# Patient Record
Sex: Male | Born: 1990 | Race: White | Hispanic: No | Marital: Married | Smoking: Never smoker
Health system: Southern US, Community
[De-identification: ages and names within clinical notes are randomized; demographics above are authoritative.]

---
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
## 2019-09-30 ENCOUNTER — Encounter (HOSPITAL_BASED_OUTPATIENT_CLINIC_OR_DEPARTMENT_OTHER): Payer: Self-pay

## 2019-09-30 ENCOUNTER — Other Ambulatory Visit: Payer: Self-pay | Admitting: General Practice

## 2019-09-30 ENCOUNTER — Encounter: Payer: Self-pay | Admitting: General Practice

## 2019-09-30 DIAGNOSIS — Z136 Encounter for screening for cardiovascular disorders: Secondary | ICD-10-CM

## 2019-10-11 ENCOUNTER — Ambulatory Visit (INDEPENDENT_AMBULATORY_CARE_PROVIDER_SITE_OTHER): Payer: Commercial Managed Care - PPO

## 2019-10-11 DIAGNOSIS — Z136 Encounter for screening for cardiovascular disorders: Secondary | ICD-10-CM

## 2020-01-17 ENCOUNTER — Emergency Department
Admission: EM | Admit: 2020-01-17 | Discharge: 2020-01-17 | Discharge status: Against Medical Advice | Payer: Commercial Managed Care - PPO | Attending: Emergency Medicine | Admitting: Emergency Medicine

## 2020-01-17 DIAGNOSIS — Z5321 Procedure and treatment not carried out due to patient leaving prior to being seen by health care provider: Secondary | ICD-10-CM | POA: Insufficient documentation

## 2020-01-17 NOTE — ED Provider Notes (Signed)
I did not see this patient     Marquette Old, MD  01/17/20 1409

## 2020-01-17 NOTE — ED Notes (Signed)
Patient left the ER.  

## 2020-01-18 ENCOUNTER — Encounter: Payer: Self-pay | Admitting: Physician Assistant

## 2020-01-18 ENCOUNTER — Emergency Department
Admission: EM | Admit: 2020-01-18 | Discharge: 2020-01-18 | Disposition: A | Discharge status: Home / Self Care | Payer: Commercial Managed Care - PPO | Attending: Student in an Organized Health Care Education/Training Program | Admitting: Student in an Organized Health Care Education/Training Program

## 2020-01-18 DIAGNOSIS — M5441 Lumbago with sciatica, right side: Secondary | ICD-10-CM | POA: Insufficient documentation

## 2020-01-18 MED ORDER — METHYLPREDNISOLONE 4 MG PO TBPK
ORAL_TABLET | ORAL | 0 refills | Status: AC
Start: 2020-01-18 — End: ?

## 2020-01-18 MED ORDER — METHOCARBAMOL 500 MG PO TABS
500.0000 mg | ORAL_TABLET | Freq: Four times a day (QID) | ORAL | 0 refills | Status: AC
Start: 2020-01-18 — End: 2020-01-25

## 2020-01-18 MED ORDER — KETOROLAC TROMETHAMINE 15 MG/ML IJ SOLN
15.0000 mg | Freq: Once | INTRAMUSCULAR | Status: AC
Start: 2020-01-18 — End: 2020-01-18
  Administered 2020-01-18: 12:00:00 15 mg via INTRAMUSCULAR
  Filled 2020-01-18: qty 1

## 2020-01-18 MED ORDER — LIDOCAINE 5 % EX PTCH
1.0000 | MEDICATED_PATCH | CUTANEOUS | 0 refills | Status: AC
Start: 2020-01-18 — End: 2020-01-28

## 2020-01-18 MED ORDER — METHOCARBAMOL 500 MG PO TABS
750.0000 mg | ORAL_TABLET | Freq: Once | ORAL | Status: AC
Start: 2020-01-18 — End: 2020-01-18
  Administered 2020-01-18: 12:00:00 750 mg via ORAL
  Filled 2020-01-18: qty 2

## 2020-01-18 MED ORDER — LIDOCAINE 5 % EX PTCH
1.0000 | MEDICATED_PATCH | CUTANEOUS | Status: DC
Start: 2020-01-18 — End: 2020-01-18
  Administered 2020-01-18: 12:00:00 1 via TRANSDERMAL
  Filled 2020-01-18: qty 1

## 2020-01-18 NOTE — Discharge Instructions (Signed)
Thank you for choosing [facility] for your emergency care needs.   We strive to provide EXCELLENT care to you and your family.      If you do not continue to improve or your condition worsens, please contact your doctor or return immediately to the Emergency Department.    DOCTOR REFERRALS  Call [phone] if you need any further referrals and we can help you find a primary care doctor or specialist.  Also, available online at:  [email or web address]      FREE HEALTH SERVICES  If you need help with health or social services, please call 2-1-1 for a free referral to resources in your area.  2-1-1 is a free service connecting people with information on health insurance, free clinics, pregnancy, mental health, dental care, food assistance, housing, and substance abuse counseling.  Also, available online at:  [email or web address].org    MEDICAL RECORDS AND TESTS  Certain laboratory test results do not come back the same day, for example urine cultures.   We will contact you if other important findings are noted.  Radiology films are often reviewed again to ensure accuracy.  If there is any discrepancy, we will notify you.      ORTHOPEDIC INJURY   Please know that significant injuries can exist even when an initial x-ray is read as normal or negative.  This can occur because some fractures (broken bones) are not initially visible on x-rays.  For this reason, close outpatient follow-up with your primary care doctor or bone specialist (orthopedist) is required.    MEDICATIONS AND FOLLOWUP  Please be aware that some prescription medications can cause drowsiness.  Use caution when driving or operating machinery.    The examination and treatment you have received in our Emergency Department is provided on an emergency basis, and is not intended to be a substitute for your primary care physician.  It is important that your doctor checks you again and that you report any new or remaining  problems at that time.

## 2020-01-18 NOTE — ED Provider Notes (Signed)
EMERGENCY DEPARTMENT HISTORY AND PHYSICAL EXAM    Date: 01/18/2020   Patient Name: Edward Murray  Attending Physician: Iva Lento, MD   Advanced Practice Provider: Bayard Hugger, PA    History of Presenting Illness     History Provided By: Patient  Chief Complaint:  Chief Complaint   Patient presents with    Back Pain     Edward Murray is a 30 y.o. male presenting to the ED with right-sided back pain x2 days.  Patient reports that he was working out, when he noted the pain.  Patient reports it radiates down the posterior aspect of his right leg.  Patient reports a history of sciatica, for which he had a microdiscectomy as teenager, and reports that this pain feels similar.  He reports that the pain is worse with movement, slightly improved with rest.  He has been taking Tylenol, without relief.    Patient denies any fever, history of IV drug abuse, bladder, bowel dysfunction, numbness, tingling, weakness to lower extremities, dysuria, hematuria, history of kidney stones.    Onset,Timing, Location, Quality, Severity, Exacerbating factors, Alleviating factors.  Associated symptoms and pertinent negative listed in ROS.  Review of Systems   Review of Systems   Constitutional: Negative for fever.   HENT: Negative for ear pain.    Eyes: Negative for pain and redness.   Respiratory: Negative for cough and shortness of breath.         No pleurisy   Cardiovascular: Negative for chest pain.   Gastrointestinal: Negative for abdominal pain.   Genitourinary: Negative for dysuria and flank pain.   Musculoskeletal: Positive for back pain. Negative for gait problem and myalgias.   Skin: Negative for rash.   Allergic/Immunologic:        No hives   Neurological: Negative for headaches.   Hematological:        No petechiae     Physical Exam   Pulse 78   BP 124/80   Resp 16   SpO2 98 %   Temp 98.4 F (36.9 C)    Pulse Oximetry Analysis - Normal SpO2: 98 % on RA    Physical Exam  Vitals and nursing note reviewed.    Constitutional:       Appearance: He is well-developed. He is not ill-appearing or toxic-appearing.      Comments: Spouse at bedside with patient.    HENT:      Head: Normocephalic and atraumatic.      Right Ear: External ear normal.      Left Ear: External ear normal.      Nose: Nose normal.   Eyes:      General: No scleral icterus.        Right eye: No discharge.         Left eye: No discharge.      Conjunctiva/sclera: Conjunctivae normal.   Cardiovascular:      Rate and Rhythm: Normal rate.      Pulses:           Dorsalis pedis pulses are 2+ on the right side and 2+ on the left side.      Heart sounds: Normal heart sounds.   Pulmonary:      Effort: Pulmonary effort is normal. No respiratory distress.      Breath sounds: Normal breath sounds. No stridor.   Abdominal:      General: Bowel sounds are normal.      Palpations: Abdomen is soft.  Tenderness: There is no abdominal tenderness. There is no guarding or rebound.   Musculoskeletal:      Cervical back: Neck supple.      Lumbar back: Tenderness present. No spasms or bony tenderness. Positive right straight leg raise test. Negative left straight leg raise test.        Back:       Comments: bilateral Lower Extremity: No obvious deformity.  There is no swelling.  There is no tenderness.  ROM is full.  Distal capillary refill takes less than 2 seconds.  PT and DP pulses are 2+ bilaterally.  Distal sensation and motor intact.     Skin:     General: Skin is warm.      Comments: Examined where exposed   Neurological:      General: No focal deficit present.      Mental Status: He is alert and oriented to person, place, and time.      Sensory: Sensation is intact.      Gait: Gait is intact.       Past History     Past Medical History:  Past Medical History:   Diagnosis Date    Sciatica 2005    Past Surgical History:  Past Surgical History:   Procedure Laterality Date    BACK SURGERY        Family/Social History:  He reports that he has never smoked. He has never  used smokeless tobacco. He reports current alcohol use. He reports previous drug use.  History reviewed. No pertinent family history. Allergies:  No Known Allergies   Listed Medications on Arrival:  Home Medications     Med List Status: Complete Set By: Glenford Peers., RN at 01/18/2020 11:52 AM        No Medications         Primary Care Provider: Pcp, None, MD     Diagnostic Study Results     Labs -     Results     ** No results found for the last 24 hours. **          Radiologic Studies -   Radiology Results (24 Hour)     ** No results found for the last 24 hours. **          Procedures   Procedures:   Procedures  Medical Decision Making   I am the first provider for this patient. I reviewed the vital signs, available nursing notes, past medical history, past surgical history, family history and social history.    Old Medical Records: Nursing notes.     Vital Signs-I have reviewed the patient's vital signs.     Patient Vitals for the past 12 hrs:   BP Temp Pulse Resp   01/18/20 1129 124/80 98.4 F (36.9 C) 78 16           Medications Given in the ED:  ED Medication Orders (From admission, onward)    Start Ordered     Status Ordering Provider    01/18/20 1152 01/18/20 1152  methocarbamol (ROBAXIN) tablet 750 mg  Once        Route: Oral  Ordered Dose: 750 mg     Last MAR action: Given Rennis Golden R    01/18/20 1145 01/18/20 1144  ketorolac (TORADOL) injection 15 mg  Once        Route: Intramuscular  Ordered Dose: 15 mg     Last MAR action: Given [place], Quindarrius [place] R  01/18/20 1145 01/18/20 1144    Every 24 hours        Route: Transdermal  Ordered Dose: 1 patch     Discontinued Torrin Crihfield R          Medications Prescribed:  Discharge Prescriptions     Medication Sig Dispense Auth. Provider    lidocaine (LIDODERM) 5 % Place 1 patch onto the skin every 24 hours for 10 days Remove & Discard patch within 12 hours or as directed by MD 10 patch Mateo Flow, [place] R, [place]    methylPREDNISolone (MEDROL  DOSPACK) 4 MG tablet Use as directed 21 tablet Eleanora Guinyard, [place] R, [place]    methocarbamol (ROBAXIN) 500 MG tablet Take 1 tablet (500 mg total) by mouth 4 (four) times daily for 7 days 28 tablet Bayard Hugger, [place]          ED Course:        Provider Notes:    30 y.o. male presents with right-sided low back pain, radiating to his right lower extremity.    Patient presents with back pain most consistent with musculoskeletal strain.  No back pain red flags on history of physical.  Presentation not consistent with malignancy, fracture (no bony tenderness), cauda equina (no bowel or urinary incontinence/retention, saddle anesthesia, no distal weakness), AAA, viscous perforation, PE, renal colic, pyelonephritis (afebrile, no CVAT, no urinary symptoms).  Given the clinical picture, no indication for imaging at this time.      Will trial NSAIDs, lidocaine topical, Solu-Medrol, Robaxin.  Patient was counseled not to drive or operate heavy machinery while taking Robaxin.          Return precautions, incidental findings, appropriate follow up instructions were all reviewed with Patient. All questions answered.         Diagnosis     Clinical Impression:   1. Acute right-sided low back pain with right-sided sciatica        Treatment Plan:   ED Disposition     ED Disposition Condition Date/Time Comment    Discharge  Sat Jan 18, 2020 11:48 AM Edward Murray discharge to home/self care.    Condition at disposition: Stable          _____________________________    CHART OWNERSHIPSherrie Mustache, PA-C, am the primary clinician of record.       Rennis Golden [place], [place]  01/18/20 2217

## 2020-01-18 NOTE — ED Triage Notes (Signed)
Patient chief complaint of back pain x2 days. Patient states history of sciatica and states feels like a flare up

## 2020-01-19 NOTE — ED Triage Notes (Signed)
I did not see this patient, pt left before evaluation.

## 2020-01-21 ENCOUNTER — Other Ambulatory Visit: Payer: Self-pay | Admitting: Anesthesiology

## 2020-01-21 DIAGNOSIS — M519 Unspecified thoracic, thoracolumbar and lumbosacral intervertebral disc disorder: Secondary | ICD-10-CM

## 2020-01-21 DIAGNOSIS — M5416 Radiculopathy, lumbar region: Secondary | ICD-10-CM

## 2020-01-24 ENCOUNTER — Ambulatory Visit: Payer: Commercial Managed Care - PPO

## 2020-01-27 ENCOUNTER — Telehealth: Payer: Commercial Managed Care - PPO

## 2022-06-03 ENCOUNTER — Other Ambulatory Visit: Payer: Self-pay | Admitting: Physician Assistant

## 2022-06-03 DIAGNOSIS — R591 Generalized enlarged lymph nodes: Secondary | ICD-10-CM

## 2022-06-06 ENCOUNTER — Ambulatory Visit: Payer: BLUE CROSS/BLUE SHIELD

## 2022-06-06 ENCOUNTER — Other Ambulatory Visit: Payer: Self-pay | Admitting: Physician Assistant

## 2022-06-06 DIAGNOSIS — R591 Generalized enlarged lymph nodes: Secondary | ICD-10-CM

## 2022-06-09 ENCOUNTER — Ambulatory Visit
Admission: RE | Admit: 2022-06-09 | Discharge: 2022-06-09 | Disposition: A | Discharge status: Home / Self Care | Payer: BLUE CROSS/BLUE SHIELD | Source: Ambulatory Visit | Attending: Physician Assistant | Admitting: Physician Assistant

## 2022-06-09 DIAGNOSIS — R591 Generalized enlarged lymph nodes: Secondary | ICD-10-CM | POA: Insufficient documentation

## 2022-07-13 ENCOUNTER — Other Ambulatory Visit: Payer: Self-pay | Admitting: Physician Assistant

## 2022-07-13 DIAGNOSIS — R221 Localized swelling, mass and lump, neck: Secondary | ICD-10-CM

## 2022-07-21 ENCOUNTER — Ambulatory Visit: Payer: BLUE CROSS/BLUE SHIELD

## 2022-08-14 ENCOUNTER — Emergency Department: Payer: Commercial Managed Care - HMO

## 2022-08-14 ENCOUNTER — Emergency Department
Admission: EM | Admit: 2022-08-14 | Discharge: 2022-08-14 | Disposition: A | Discharge status: Home / Self Care | Payer: Commercial Managed Care - HMO | Attending: Emergency Medicine | Admitting: Emergency Medicine

## 2022-08-14 DIAGNOSIS — R110 Nausea: Secondary | ICD-10-CM | POA: Insufficient documentation

## 2022-08-14 DIAGNOSIS — R1013 Epigastric pain: Secondary | ICD-10-CM

## 2022-08-14 LAB — EPSTEIN-BARR VIRUS (EBV) VCA, IGM: EBV VCA Ab, IgM: <10 U/mL (ref 0.0–35.9)

## 2022-08-14 LAB — LAB USE ONLY - CBC WITH DIFFERENTIAL
Absolute Basophils: 0.04 10*3/uL (ref 0.00–0.08)
Absolute Eosinophils: 0.07 10*3/uL (ref 0.00–0.44)
Absolute Immature Granulocytes: 0.02 10*3/uL (ref 0.00–0.07)
Absolute Lymphocytes: 2.3 10*3/uL (ref 0.42–3.22)
Absolute Monocytes: 0.53 10*3/uL (ref 0.21–0.85)
Absolute Neutrophils: 3.54 10*3/uL (ref 1.10–6.33)
Absolute nRBC: 0 10*3/uL (ref <=0.00)
Basophils %: 0.6 %
Eosinophils %: 1.1 %
Hematocrit: 47.6 % (ref 37.6–49.6)
Hemoglobin: 17.2 g/dL — ABNORMAL HIGH (ref 12.5–17.1)
Immature Granulocytes %: 0.3 %
Lymphocytes %: 35.4 %
MCH: 29.1 pg (ref 25.1–33.5)
MCHC: 36.1 g/dL — ABNORMAL HIGH (ref 31.5–35.8)
MCV: 80.4 fL (ref 78.0–96.0)
MPV: 10.9 fL (ref 8.9–12.5)
Monocytes %: 8.2 %
Neutrophils %: 54.4 %
Platelet Count: 196 10*3/uL (ref 142–346)
Preliminary Absolute Neutrophil Count: 3.54 10*3/uL (ref 1.10–6.33)
RBC: 5.92 10*6/uL — ABNORMAL HIGH (ref 4.20–5.90)
RDW: 12 % (ref 11–15)
WBC: 6.5 10*3/uL (ref 3.10–9.50)
nRBC %: 0 /100 WBC (ref <=0.0)

## 2022-08-14 LAB — COMPREHENSIVE METABOLIC PANEL
ALT: 27 U/L (ref 0–55)
AST (SGOT): 16 U/L (ref 5–41)
Albumin/Globulin Ratio: 1.7 (ref 0.9–2.2)
Albumin: 4.8 g/dL (ref 3.5–5.0)
Alkaline Phosphatase: 61 U/L (ref 37–117)
Anion Gap: 9 (ref 5.0–15.0)
BUN: 11 mg/dL (ref 9–28)
Bilirubin, Total: 1.1 mg/dL (ref 0.2–1.2)
CO2: 24 mEq/L (ref 17–29)
Calcium: 10.1 mg/dL (ref 8.5–10.5)
Chloride: 105 mEq/L (ref 99–111)
Creatinine: 0.8 mg/dL (ref 0.5–1.5)
GFR: >60 mL/min/{1.73_m2} (ref >=60.0)
Globulin: 2.8 g/dL (ref 2.0–3.6)
Glucose: 105 mg/dL — ABNORMAL HIGH (ref 70–100)
Potassium: 3.6 mEq/L (ref 3.5–5.3)
Protein, Total: 7.6 g/dL (ref 6.0–8.3)
Sodium: 138 mEq/L (ref 135–145)

## 2022-08-14 LAB — URINALYSIS WITH REFLEX TO MICROSCOPIC EXAM - REFLEX TO CULTURE
Urine Bilirubin: NEGATIVE
Urine Blood: NEGATIVE
Urine Glucose: NEGATIVE
Urine Ketones: NEGATIVE mg/dL
Urine Leukocyte Esterase: NEGATIVE
Urine Nitrite: NEGATIVE
Urine Protein: NEGATIVE
Urine Specific Gravity: 1.02 (ref 1.001–1.035)
Urine Urobilinogen: NORMAL mg/dL (ref 0.2–2.0)
Urine pH: 6.5 (ref 5.0–8.0)

## 2022-08-14 LAB — MONONUCLEOSIS SCREEN: Mononucleosis Screen: NEGATIVE

## 2022-08-14 LAB — LIPASE: Lipase: 22 U/L (ref 8–78)

## 2022-08-14 LAB — LAB USE ONLY - URINE GRAY CULTURE HOLD TUBE

## 2022-08-14 MED ORDER — ONDANSETRON 4 MG PO TBDP
4.0000 mg | ORAL_TABLET | Freq: Four times a day (QID) | ORAL | 0 refills | Status: AC | PRN
Start: 2022-08-14 — End: ?

## 2022-08-14 MED ORDER — ONDANSETRON HCL 4 MG/2ML IJ SOLN
4.0000 mg | Freq: Once | INTRAMUSCULAR | Status: AC
Start: 2022-08-14 — End: 2022-08-14
  Administered 2022-08-14: 4 mg via INTRAVENOUS
  Filled 2022-08-14: qty 2

## 2022-08-14 MED ORDER — IOHEXOL 350 MG/ML IV SOLN
100.0000 mL | Freq: Once | INTRAVENOUS | Status: AC | PRN
Start: 2022-08-14 — End: 2022-08-14
  Administered 2022-08-14: 100 mL via INTRAVENOUS

## 2022-08-14 MED ORDER — FAMOTIDINE 10 MG/ML IV SOLN (WRAP)
20.0000 mg | Freq: Once | INTRAVENOUS | Status: AC
Start: 2022-08-14 — End: 2022-08-14
  Administered 2022-08-14: 20 mg via INTRAVENOUS
  Filled 2022-08-14: qty 2

## 2022-08-14 MED ORDER — OMEPRAZOLE MAGNESIUM 20 MG PO TBEC
20.0000 mg | DELAYED_RELEASE_TABLET | Freq: Every day | ORAL | 0 refills | Status: AC
Start: 2022-08-14 — End: 2022-08-28

## 2022-08-14 MED ORDER — SODIUM CHLORIDE 0.9 % IV BOLUS
1000.0000 mL | Freq: Once | INTRAVENOUS | Status: AC
Start: 2022-08-14 — End: 2022-08-14
  Administered 2022-08-14: 1000 mL via INTRAVENOUS

## 2022-08-14 NOTE — ED Provider Notes (Signed)
ED GENERAL NOTE      None      Date: 08/14/2022   Patient Name: Edward Murray  Attending Physician: Joya San, MD   Advanced Practice Provider: Everlene Balls, PA    Chief Complaint:  Chief Complaint   Patient presents with    Abdominal Pain    Nausea        Medical Decision Making     Pt is a 32 y.o. male presenting to the ER with c/o nausea, epigastric abdominal cramping x7-10 days.    Pt arrives with BP 163/97. HR 64. Temp 98.2. SpO2 100% on RA  Patient non-toxic appearing.    Abdominal exam without peritoneal signs.  Abdomen soft and nontender.  No evidence of acute abdomen at this time.    CBC without leukocytosis.  Hemoglobin 17.2 consistent with degree of possible hemoconcentration.  CMP blood glucose 105 otherwise within normal limits.  No transaminitis, elevated bilirubin.  Lipase reassuring at 22    Right upper quadrant ultrasound which is negative for cholelithiasis or signs of biliary obstruction.    Monospot negative, EBV IgM sent and pending    Reassessed patient who appears anxious.  He has googled left supraclavicular lymph node concerning for possible gastric cancer.  He does not have any significant risk factors for gastric cancer.  Denies B symptoms such as unexplained weight loss or night sweats.  No palpable supraclavicular lymphadenopathy on my exam.  Through shared decision making expanded workup to include CT abdomen and pelvis which did not show any abnormal lymphadenopathy or large mass.  Noted trace free fluid in pelvis which is abnormal finding. Discussed this with patient.  Do not suspect acute perforation or GI bleed based on history and physical.    Discussed results with patient.  Clinical presentation most consistent with acute gastritis.  Will treat with course of Prilosec x 14 days and lifestyle modifications.  Did however provide patient with GI follow-up information.  Discussed that workup overall reassuring from the emergency department though he may need further  outpatient workup if symptoms persist.    On reassessment, Pt remains well appearing  and discussed results with patient. They are agreeable to plan. Strict ED return precautions given.    Medications given in the ED:  ED Medication Orders (From admission, onward)      Start Ordered     Status Ordering Provider    08/14/22 1616 08/14/22 1616  iohexol (OMNIPAQUE) 350 MG/ML injection 100 mL  IMG once as needed        Route: Intravenous  Ordered Dose: 100 mL       Last MAR action: Imaging Agent Given Kelly Splinter A    08/14/22 1502 08/14/22 1501  famotidine (PEPCID) injection 20 mg  Once        Route: Intravenous  Ordered Dose: 20 mg       Last MAR action: Given BETTS, TAYLOR K    08/14/22 1447 08/14/22 1446  sodium chloride 0.9 % bolus 1,000 mL  Once        Route: Intravenous  Ordered Dose: 1,000 mL       Last MAR action: Stopped BETTS, TAYLOR K    08/14/22 1435 08/14/22 1434  ondansetron (ZOFRAN) injection 4 mg  Once        Route: Intravenous  Ordered Dose: 4 mg       Last MAR action: Given LEWIS, KATHERINE A  Clinical Decision Support:       Vital Signs- (reviewed)   Patient Vitals for the past 12 hrs:   BP Temp Pulse Resp   08/14/22 1704 155/86 -- 66 16   08/14/22 1558 129/76 -- 67 17   08/14/22 1431 (!) 163/97 98.2 F (36.8 C) 64 18                Diagnosis     Clinical Impression:   1. Epigastric pain        Treatment Plan:   ED Disposition       ED Disposition   Discharge    Condition   --    Date/Time   Sun Aug 14, 2022  5:00 PM    Comment   ARVLE PRIDEAUX discharge to home/self care.    Condition at disposition: Stable                 Followup: (See discharge instructions if not listed here)     Medications Prescribed: (Note: any 600mg  or 800mg  ibuprofen tablets are prescription dosing, not OTC)  Discharge Prescriptions       Medication Sig Dispense Auth. Provider    omeprazole (PriLOSEC OTC) 20 MG tablet Take 1 tablet (20 mg) by mouth daily for 14 days 14 tablet Betts, Taylor K, PA     ondansetron (ZOFRAN-ODT) 4 MG disintegrating tablet Take 1 tablet (4 mg) by mouth every 6 (six) hours as needed for Nausea 8 tablet Everlene Balls, PA            Followup: (See discharge instructions if not listed here)     Though other pathology possible, pt is without current signs of instability, medical urgency or emergency.  Pt non-toxic appearing at discharge.  Pt/caregiver understands possibility of progression of disease, need for continued care and assessment outpatient.  Aftercare and return precautions discussed.    Discussed clinical case, plan of care, and disposition with Joya San, MD     Discharge Medication List as of 08/14/2022  5:00 PM        START taking these medications    Details   omeprazole (PriLOSEC OTC) 20 MG tablet Take 1 tablet (20 mg) by mouth daily for 14 days, Starting Sun 08/14/2022, Until Sun 08/28/2022, E-Rx      ondansetron (ZOFRAN-ODT) 4 MG disintegrating tablet Take 1 tablet (4 mg) by mouth every 6 (six) hours as needed for Nausea, Starting Sun 08/14/2022, E-Rx                History of Presenting Illness   Edward Murray is a 32 y.o. y.o male preseting to the ER with c/o epigastric  abdominal pain.     Location: in the epigastrium without radiation  Quality: cramping  Chronicity: Onset 10 days ago, gradually worsening since onset  Aggravating factors: eating, immediate in onset.  No specific foods trigger his the symptoms.  He denies any NSAID use.  He does drink alcohol socially, 2-3 drinks on the weekends though this does not disproportionately trigger symptoms.  Alleviating factors: none  Associated symptoms: nausea    Patient was seen for lymphadenopathy to the left side of the neck and left clavicle a couple weeks ago.  Symptoms started after sore throat.  He was seen by his primary care and had lab work done to include a mono which was negative.  He also had a CT scan of his neck but this did not show anything because the  lymphadenopathy had resolved at the time.   He also had diarrhea when symptoms onset.  He was told by urgent care he probably has a virus.  He is returning to the ER because he has had continued epigastric cramping which is gradually worsening over time.     He does note that he has a 38-month-old and a 62-year-old at home so he is not getting a lot of sleep but this is not out of the ordinary for him.  Denies melena, hematochezia, coffee-ground emesis.  No emesis at all.        Abdominal Pain       PCP: Pcp, None, MD       Physical exam     Physical Exam  Constitutional:       General: He is not in acute distress.     Appearance: He is not ill-appearing or toxic-appearing.   HENT:      Mouth/Throat:      Mouth: Mucous membranes are moist.      Pharynx: Oropharynx is clear.   Eyes:      General: No scleral icterus.  Pulmonary:      Effort: Pulmonary effort is normal. No respiratory distress.   Abdominal:      General: There is no distension.      Palpations: Abdomen is soft. There is no mass.      Tenderness: There is no abdominal tenderness. There is no right CVA tenderness, left CVA tenderness or guarding.   Musculoskeletal:      Cervical back: Neck supple.   Lymphadenopathy:      Cervical:      Right cervical: No superficial or posterior cervical adenopathy.     Left cervical: No posterior cervical adenopathy.      Upper Body:      Right upper body: No supraclavicular adenopathy.      Left upper body: No supraclavicular adenopathy.   Neurological:      Mental Status: He is alert.      Gait: Gait normal.            Procedures     Procedures               Vital Signs:  Vitals:    08/14/22 1431 08/14/22 1558 08/14/22 1704   BP: (!) 163/97 129/76 155/86   Pulse: 64 67 66   Resp: 18 17 16    Temp: 98.2 F (36.8 C)     SpO2: 100% 100% 98%   Weight: 89.4 kg     Height:   6\' 1"  (1.854 m)         Past History     Medical History[1]     Past Surgical History:   Procedure Laterality Date    BACK SURGERY         Allergies[2]     Diagnostic Study Results     EKG:  N/A    Imaging Interpreted by me: CT no obvious stomach mass per my independent interpretation  CT Abd/Pelvis with IV Contrast only   Final Result      1. No bowel obstruction or detectable bowel inflammation; normal appendix.   2. Minimal air and fluid distention small bowel.   3. No renal calculi or hydronephrosis.   4. Trace pelvic fluid.   5. Remainder as above.      Wilmon Pali, MD   08/14/2022 4:49 PM      US Abdomen Limited RUQ   Final Result  Normal exam.      Al Decant, MD   08/14/2022 3:57 PM          Labs:   Results       Procedure Component Value Units Date/Time    Urine Wallace Cullens Culture Hold Tube [[id number]] Collected: 08/14/22 1441    Specimen: Urine, Clean Catch Updated: 08/14/22 1600     Extra Tube Hold for add-ons.    Lipase [[id number]]  (Normal) Collected: 08/14/22 1454    Specimen: Blood, Venous Updated: 08/14/22 1558     Lipase 22 U/L     Mononucleosis Screen [[id number]]  (Normal) Collected: 08/14/22 1454    Specimen: Blood, Venous Updated: 08/14/22 1532     Mononucleosis Screen Negative    Comprehensive Metabolic Panel [[id number]]  (Abnormal) Collected: 08/14/22 1454    Specimen: Blood, Venous Updated: 08/14/22 1519     Glucose 105 mg/dL      BUN 11 mg/dL      Creatinine 0.8 mg/dL      Sodium 629 mEq/L      Potassium 3.6 mEq/L      Chloride 105 mEq/L      CO2 24 mEq/L      Calcium 10.1 mg/dL      Anion Gap 9.0     GFR >60.0 mL/min/1.73 m2      AST (SGOT) 16 U/L      ALT 27 U/L      Alkaline Phosphatase 61 U/L      Albumin 4.8 g/dL      Protein, Total 7.6 g/dL      Globulin 2.8 g/dL      Albumin/Globulin Ratio 1.7     Bilirubin, Total 1.1 mg/dL     Epstein-Barr Virus (EBV) VCA, IgM [[id number]] Collected: 08/14/22 1454    Specimen: Blood, Venous Updated: 08/14/22 1512    CBC with Differential (Order) [[id number]]  (Abnormal) Collected: 08/14/22 1454    Specimen: Blood, Venous Updated: 08/14/22 1502    Narrative:      The following orders were created for panel order CBC with Differential  (Order).  Procedure                               Abnormality         Status                     ---------                               -----------         ------                     CBC with Differential (C.Marland KitchenMarland Kitchen[[id number]]  Abnormal            Final result                 Please view results for these tests on the individual orders.    CBC with Differential (Component) [[id number]]  (Abnormal) Collected: 08/14/22 1454    Specimen: Blood, Venous Updated: 08/14/22 1502     WBC 6.50 x10 3/uL      Hemoglobin 17.2 g/dL      Hematocrit 43.3 %      Platelet Count 196 x10 3/uL      MPV 10.9 fL      RBC 5.92 x10 6/uL  MCV 80.4 fL      MCH 29.1 pg      MCHC 36.1 g/dL      RDW 12 %      nRBC % 0.0 /100 WBC      Absolute nRBC 0.00 x10 3/uL      Preliminary Absolute Neutrophil Count 3.54 x10 3/uL      Neutrophils % 54.4 %      Lymphocytes % 35.4 %      Monocytes % 8.2 %      Eosinophils % 1.1 %      Basophils % 0.6 %      Immature Granulocytes % 0.3 %      Absolute Neutrophils 3.54 x10 3/uL      Absolute Lymphocytes 2.30 x10 3/uL      Absolute Monocytes 0.53 x10 3/uL      Absolute Eosinophils 0.07 x10 3/uL      Absolute Basophils 0.04 x10 3/uL      Absolute Immature Granulocytes 0.02 x10 3/uL     Urinalysis with Reflex to Microscopic Exam and Culture [[id number]]  (Normal) Collected: 08/14/22 1441    Specimen: Urine, Clean Catch Updated: 08/14/22 1450     Urine Color Yellow     Urine Clarity Clear     Urine Specific Gravity 1.020     Urine pH 6.5     Urine Leukocyte Esterase Negative     Urine Nitrite Negative     Urine Protein Negative     Urine Glucose Negative     Urine Ketones Negative mg/dL      Urine Urobilinogen Normal mg/dL      Urine Bilirubin Negative     Urine Blood Negative    Narrative:      Urine Microscopic not indicated            CHART OWNERSHIP: I, Everlene Balls, PA,  am the primary clinician of record.         [1]   Past Medical History:  Diagnosis Date    Sciatica 2005   [2] No Known Allergies       Everlene Balls, PA  08/14/22 1718       Joya San, MD  08/14/22 1724

## 2022-08-14 NOTE — ED Notes (Signed)
US at bedside

## 2022-08-14 NOTE — Discharge Instructions (Addendum)
You were evaluated in the Emergency Department today for epigastric pain, which is most likely due to irritation of the lining of your stomach.     You can take Mylanta, which is available over the counter, to help manage your symptoms. Avoid spicy or acidic foods, NSAIDs (ibuprofen/aleve...).  Take Prilosec medication first thing in the morning 30 minutes before eating or drinking.  Take nausea medication as needed.    Take medication for 2 weeks and follow lifestyle modifications.  If symptoms persist please follow-up with GI specialist for further testing.    Return to the Emergency Department if you experience shortness of breath, worsening or uncontrolled abdominal or chest pain, headache, light headedness, feeling faint, nausea, vomiting, bloody vomit or stools, black tarry stools, or any other concerning symptoms.  Thank you for choosing [place] for your care.

## 2022-08-15 ENCOUNTER — Telehealth: Payer: Self-pay

## 2022-08-15 NOTE — Telephone Encounter (Signed)
This Encounter is being sent to the [facility] Gastroenterology Navigator Pool for patients seen in an [facility] Emergency Room or admitted in the Emergency Department  Patient has been informed the turnaround is 5 business days to hear back. This is going to be sent [employer] before sending. Identify if they are self pay and wish to be self pay for consults / procedures.   If no insurance and they don't wish to be self pay refer to [facility] (look in sharepoint)    Bold the option here if the patient is either Self Pay or [place] [employer](Ignore this line if its neither)      [facility] Admission or ER visit for:   Abdominal Pain/ Nausea         Patients Preferred Gastro Office: (bold at least one)  [facility]   [facility] [employer] / [place]        PATIENT NAME: Edward Murray    Best Preferred Call Back #: Mobile#:[phone] (mobile)     Home #:[phone] (home)

## 2023-01-26 ENCOUNTER — Encounter: Payer: Self-pay | Admitting: Family Nurse Practitioner

## 2023-01-26 DIAGNOSIS — K6289 Other specified diseases of anus and rectum: Secondary | ICD-10-CM

## 2023-01-27 ENCOUNTER — Ambulatory Visit (INDEPENDENT_AMBULATORY_CARE_PROVIDER_SITE_OTHER): Payer: Commercial Managed Care - HMO | Admitting: Physician Assistant
